# Patient Record
Sex: Male | Born: 1980 | Race: White | Hispanic: No | Marital: Married | Smoking: Current every day smoker
Health system: Southern US, Community
[De-identification: ages and names within clinical notes are randomized; demographics above are authoritative.]

## PROBLEM LIST (undated history)

## (undated) DIAGNOSIS — G629 Polyneuropathy, unspecified: Secondary | ICD-10-CM

## (undated) DIAGNOSIS — I10 Essential (primary) hypertension: Secondary | ICD-10-CM

## (undated) DIAGNOSIS — F1110 Opioid abuse, uncomplicated: Secondary | ICD-10-CM

## (undated) DIAGNOSIS — F419 Anxiety disorder, unspecified: Secondary | ICD-10-CM

## (undated) DIAGNOSIS — F32A Depression, unspecified: Secondary | ICD-10-CM

## (undated) DIAGNOSIS — R000 Tachycardia, unspecified: Secondary | ICD-10-CM

## (undated) HISTORY — PX: HAND SURGERY: SHX662

## (undated) HISTORY — DX: Polyneuropathy, unspecified: G62.9

## (undated) HISTORY — DX: Tachycardia, unspecified: R00.0

## (undated) HISTORY — PX: OTHER SURGICAL HISTORY: SHX169

---
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
## 2010-03-07 ENCOUNTER — Emergency Department (HOSPITAL_COMMUNITY): Admission: EM | Admit: 2010-03-07 | Discharge: 2010-03-07 | Payer: Self-pay | Admitting: Emergency Medicine

## 2010-07-17 LAB — URINALYSIS, ROUTINE W REFLEX MICROSCOPIC
Bilirubin Urine: NEGATIVE
Glucose, UA: NEGATIVE mg/dL
Hgb urine dipstick: NEGATIVE
Ketones, ur: NEGATIVE mg/dL
Protein, ur: 30 mg/dL — AB
Urobilinogen, UA: 0.2 mg/dL (ref 0.0–1.0)

## 2010-07-17 LAB — COMPREHENSIVE METABOLIC PANEL
ALT: 17 U/L (ref 0–53)
AST: 23 U/L (ref 0–37)
CO2: 25 mEq/L (ref 19–32)
Calcium: 9.4 mg/dL (ref 8.4–10.5)
Chloride: 102 mEq/L (ref 96–112)
Creatinine, Ser: 1.02 mg/dL (ref 0.4–1.5)
GFR calc non Af Amer: >60 mL/min (ref >60)
Glucose, Bld: 222 mg/dL — ABNORMAL HIGH (ref 70–99)
Sodium: 140 mEq/L (ref 135–145)
Total Bilirubin: 0.7 mg/dL (ref 0.3–1.2)

## 2010-07-17 LAB — CBC
HCT: 45.5 % (ref 39.0–52.0)
Hemoglobin: 15.4 g/dL (ref 13.0–17.0)
MCH: 31 pg (ref 26.0–34.0)
MCHC: 33.9 g/dL (ref 30.0–36.0)
RBC: 4.97 MIL/uL (ref 4.22–5.81)

## 2010-07-17 LAB — URINE MICROSCOPIC-ADD ON

## 2010-07-17 LAB — DIFFERENTIAL
Basophils Absolute: 0 10*3/uL (ref 0.0–0.1)
Basophils Relative: 0 % (ref 0–1)
Eosinophils Absolute: 0.1 10*3/uL (ref 0.0–0.7)
Eosinophils Relative: 1 % (ref 0–5)
Lymphs Abs: 4.5 10*3/uL — ABNORMAL HIGH (ref 0.7–4.0)
Neutrophils Relative %: 65 % (ref 43–77)

## 2010-07-17 LAB — RAPID URINE DRUG SCREEN, HOSP PERFORMED
Cocaine: NOT DETECTED
Opiates: POSITIVE — AB

## 2010-09-11 ENCOUNTER — Emergency Department (HOSPITAL_COMMUNITY)
Admission: EM | Admit: 2010-09-11 | Discharge: 2010-09-11 | Disposition: A | Discharge status: Home / Self Care | Payer: Self-pay | Attending: Emergency Medicine | Admitting: Emergency Medicine

## 2010-09-11 DIAGNOSIS — K089 Disorder of teeth and supporting structures, unspecified: Secondary | ICD-10-CM | POA: Insufficient documentation

## 2011-01-12 ENCOUNTER — Emergency Department (HOSPITAL_COMMUNITY)
Admission: EM | Admit: 2011-01-12 | Discharge: 2011-01-12 | Disposition: A | Discharge status: Home / Self Care | Payer: Self-pay | Attending: Emergency Medicine | Admitting: Emergency Medicine

## 2011-01-12 ENCOUNTER — Emergency Department (HOSPITAL_COMMUNITY): Payer: Self-pay

## 2011-01-12 DIAGNOSIS — M542 Cervicalgia: Secondary | ICD-10-CM | POA: Insufficient documentation

## 2011-01-12 DIAGNOSIS — R29898 Other symptoms and signs involving the musculoskeletal system: Secondary | ICD-10-CM | POA: Insufficient documentation

## 2011-01-12 DIAGNOSIS — M25519 Pain in unspecified shoulder: Secondary | ICD-10-CM | POA: Insufficient documentation

## 2011-11-09 IMAGING — CR DG CHEST 1V PORT
1 series · 1 of 1 positions shown · non-contrast
Comparison: None.

CLINICAL DATA: Patient unresponsive.  Shortness of breath.

PORTABLE CHEST - 1 VIEW

[series [date]]
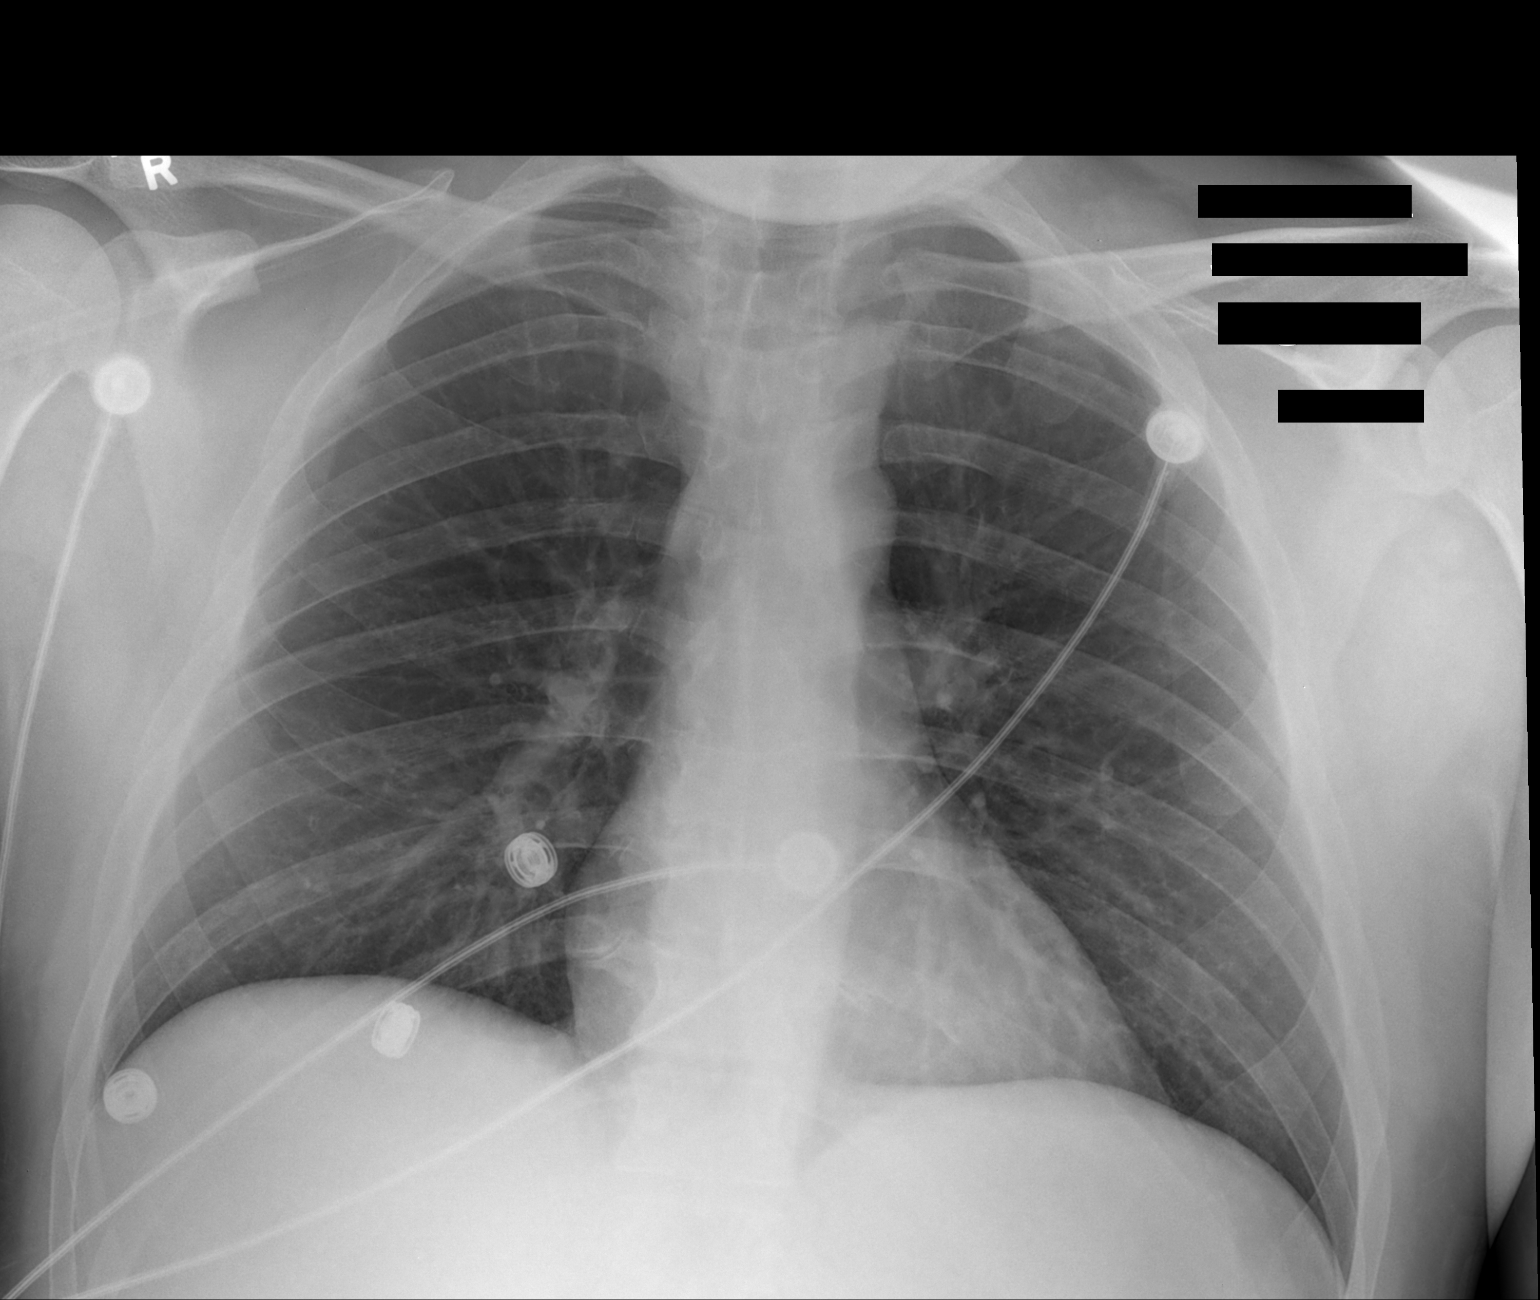

[1 of 1 positions shown; findings below may reference images not displayed]

FINDINGS: Lungs are clear.  No pleural effusion.  Heart size
normal.  No focal bony abnormality.
IMPRESSION: No acute disease.

## 2012-09-15 IMAGING — CR DG SHOULDER 2+V*L*
3 series · 3 of 3 positions shown · non-contrast
Comparison: None.

CLINICAL DATA: Pain after sleeping on shoulder wrong way.

LEFT SHOULDER - 2+ VIEW

[w shoulder internal left]
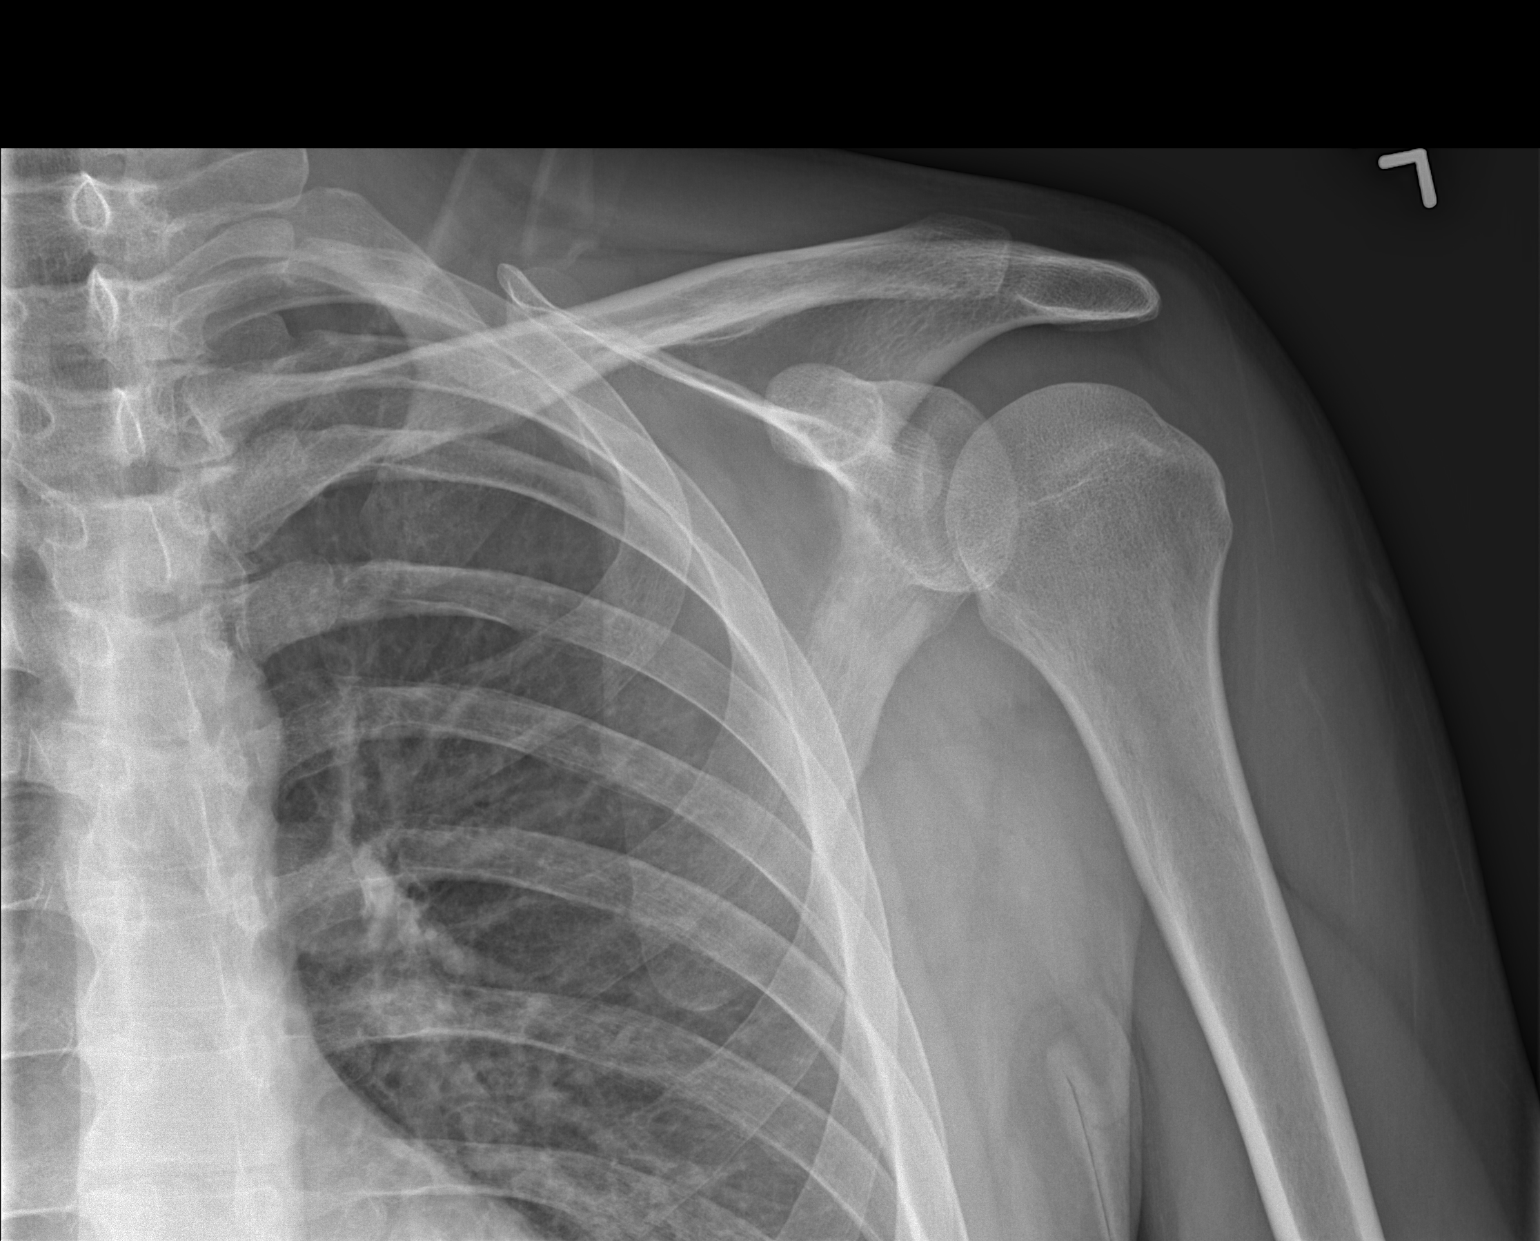

[w shoulder external left]
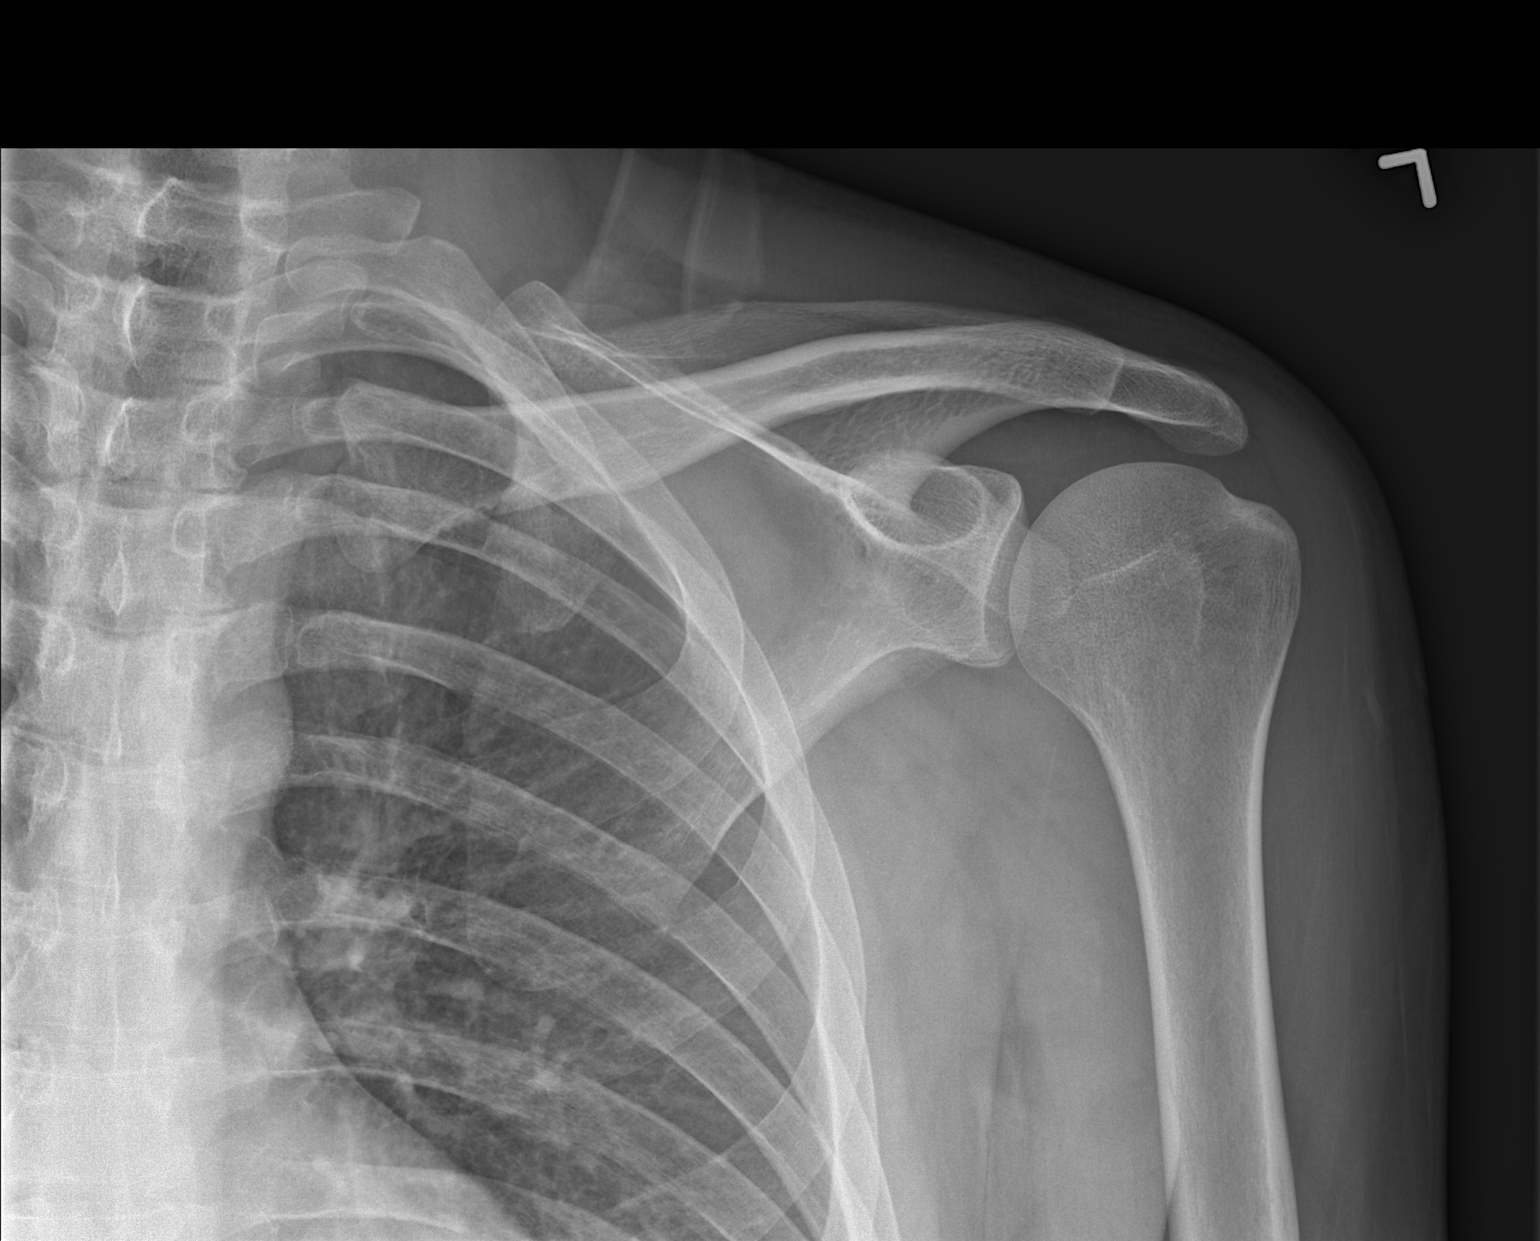

[w shoulder y-view left]
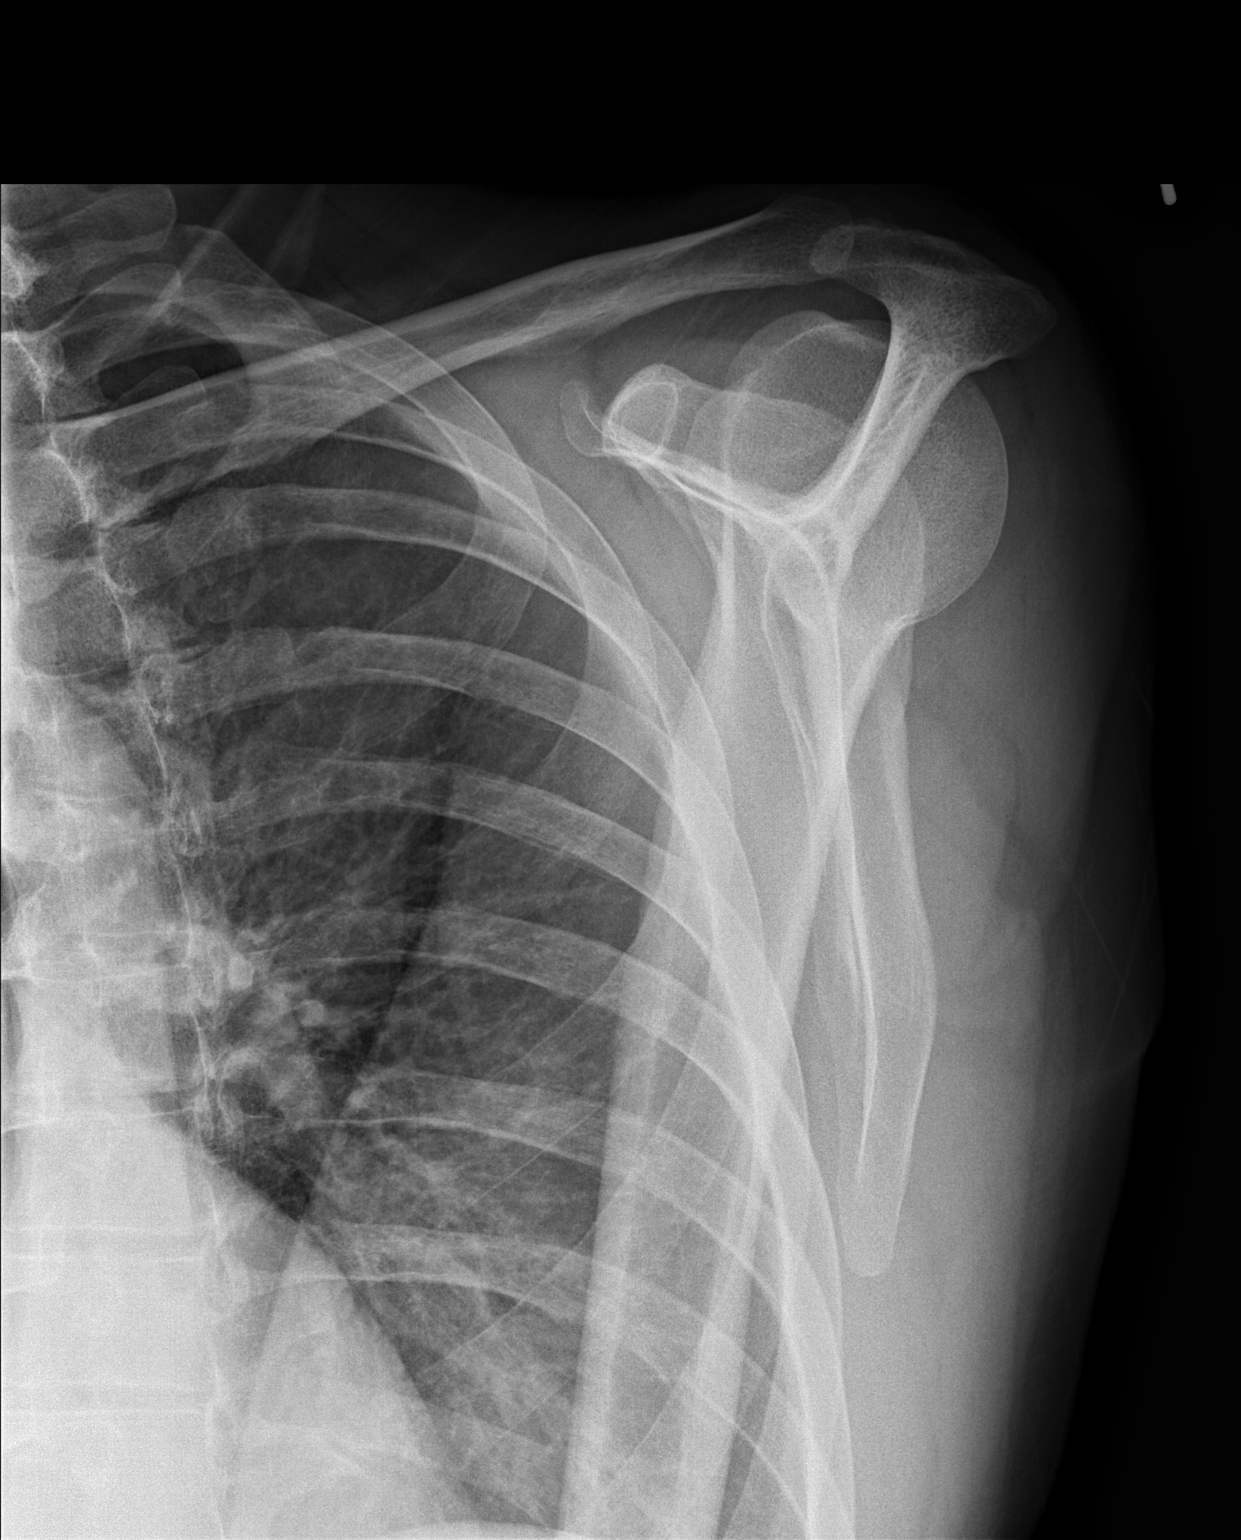

[3 of 3 positions shown; findings below may reference images not displayed]

FINDINGS: No fracture dislocation.  No soft tissue calcifications.
No significant degenerative changes.  Visualized lungs clear.
IMPRESSION: Negative.

## 2012-09-15 IMAGING — CR DG CERVICAL SPINE COMPLETE 4+V
5 series · 5 of 5 positions shown · non-contrast
Comparison: None

CLINICAL DATA: Left-sided pain

CERVICAL SPINE - COMPLETE 4+ VIEW

[w cervical spine ap]
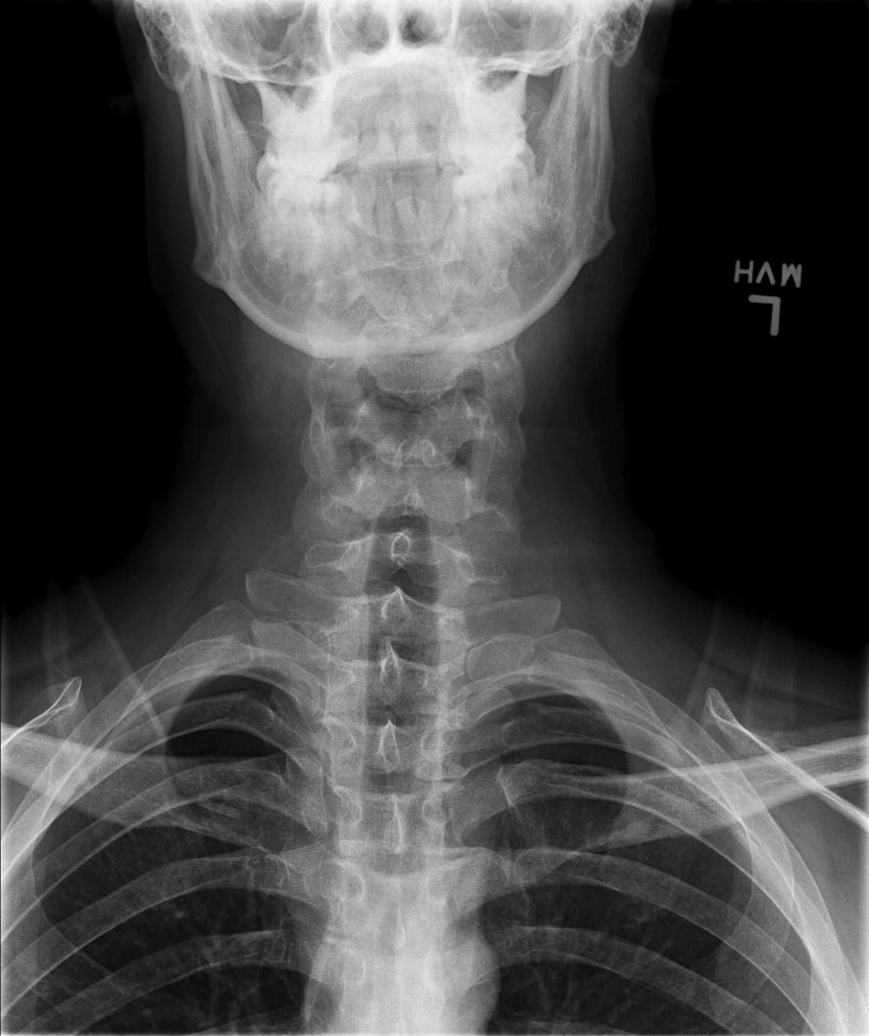

[w cervical spine odontoid]
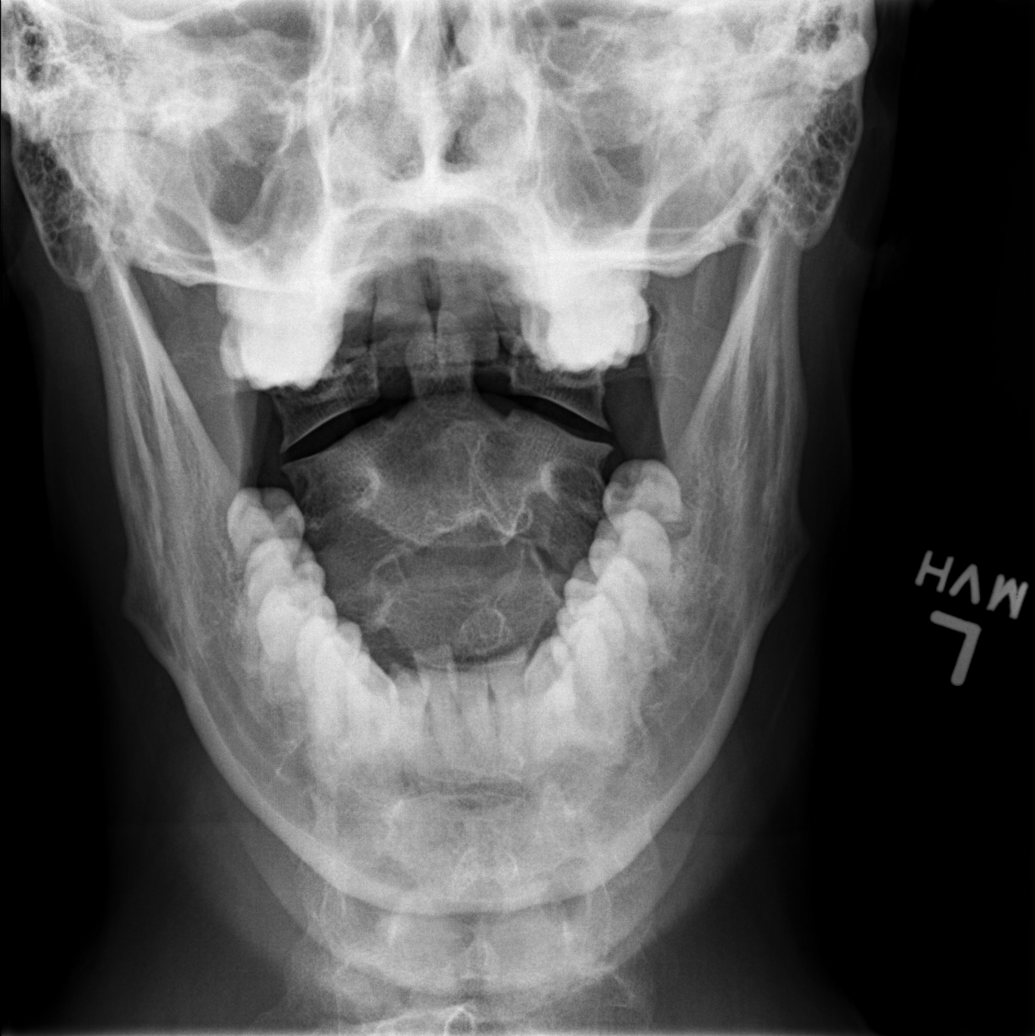

[w cervical spine lat]
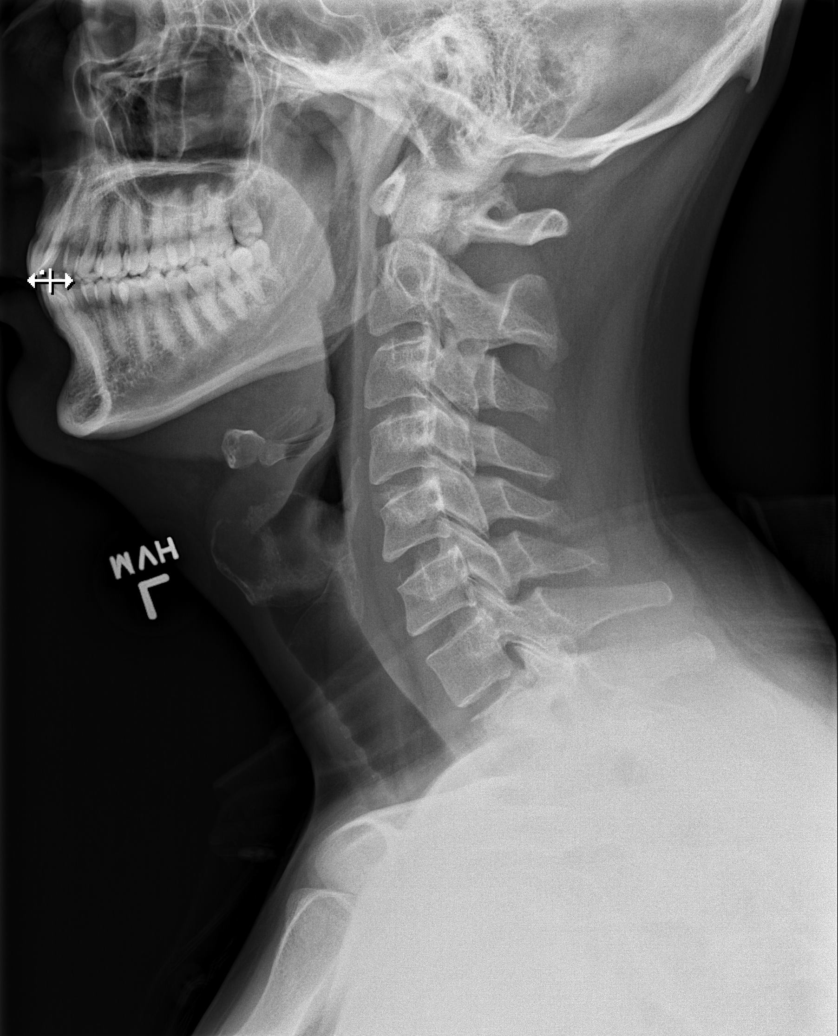

[w cervical spine ap_obl (1 of 2)]
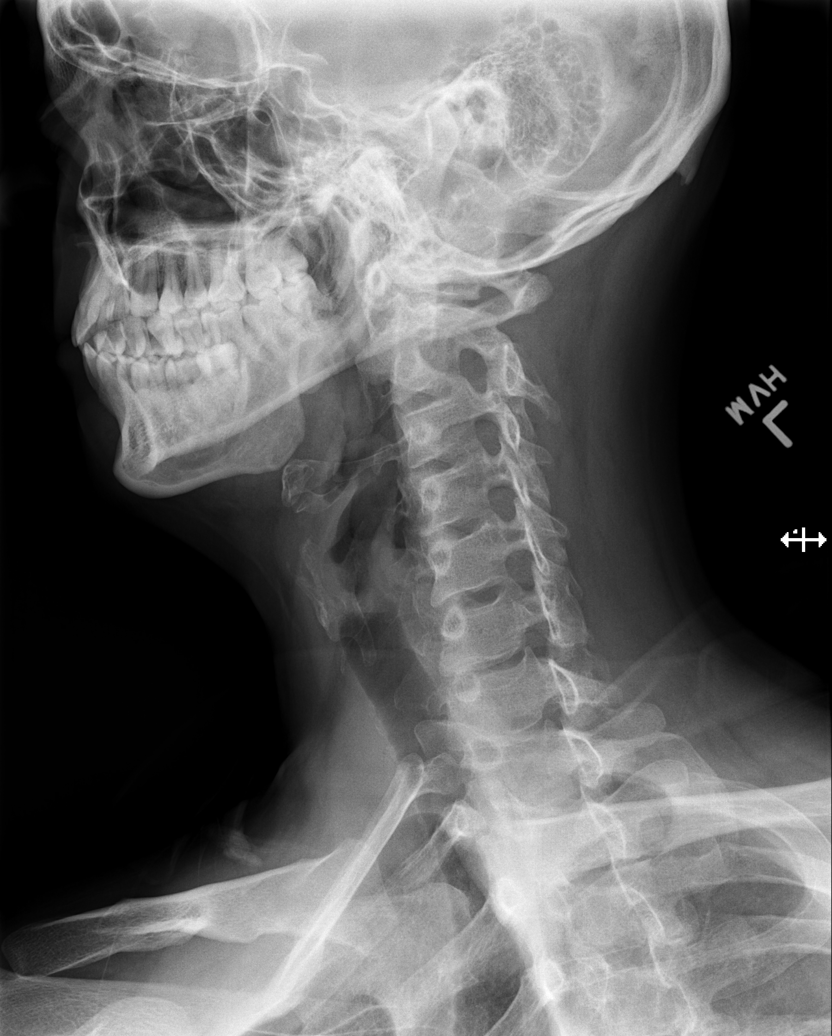

[w cervical spine ap_obl (2 of 2)]
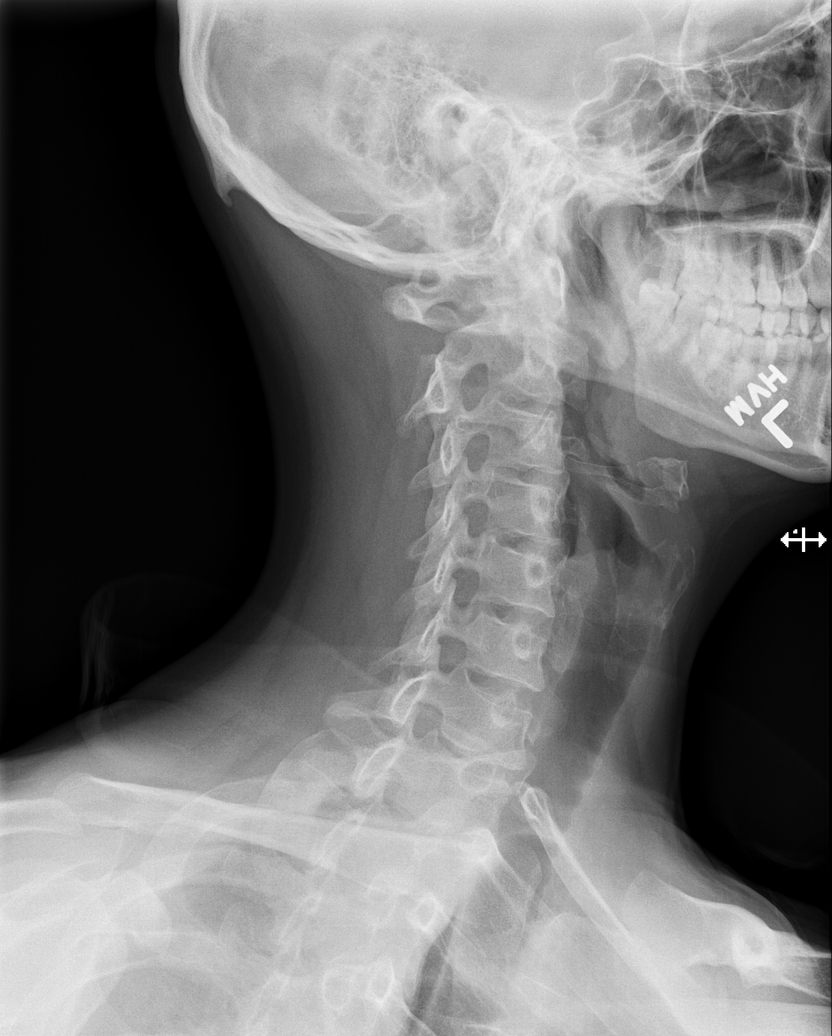

[5 of 5 positions shown; findings below may reference images not displayed]

FINDINGS: Alignment is normal.  No soft tissue swelling.  No disc
space narrowing.  No osteophyte formation.  Canal and foramina
appear widely patent.
IMPRESSION: Normal radiographs

## 2018-07-09 ENCOUNTER — Emergency Department (HOSPITAL_COMMUNITY): Payer: Self-pay

## 2018-07-09 ENCOUNTER — Other Ambulatory Visit: Payer: Self-pay

## 2018-07-09 ENCOUNTER — Encounter (HOSPITAL_COMMUNITY): Payer: Self-pay | Admitting: *Deleted

## 2018-07-09 ENCOUNTER — Emergency Department (HOSPITAL_COMMUNITY)
Admission: EM | Admit: 2018-07-09 | Discharge: 2018-07-09 | Disposition: A | Discharge status: Home / Self Care | Payer: Self-pay | Attending: Emergency Medicine | Admitting: Emergency Medicine

## 2018-07-09 DIAGNOSIS — X501XXA Overexertion from prolonged static or awkward postures, initial encounter: Secondary | ICD-10-CM | POA: Insufficient documentation

## 2018-07-09 DIAGNOSIS — Y939 Activity, unspecified: Secondary | ICD-10-CM | POA: Insufficient documentation

## 2018-07-09 DIAGNOSIS — M19079 Primary osteoarthritis, unspecified ankle and foot: Secondary | ICD-10-CM

## 2018-07-09 DIAGNOSIS — F17210 Nicotine dependence, cigarettes, uncomplicated: Secondary | ICD-10-CM | POA: Insufficient documentation

## 2018-07-09 DIAGNOSIS — S93401A Sprain of unspecified ligament of right ankle, initial encounter: Secondary | ICD-10-CM

## 2018-07-09 DIAGNOSIS — Y929 Unspecified place or not applicable: Secondary | ICD-10-CM | POA: Insufficient documentation

## 2018-07-09 DIAGNOSIS — Y999 Unspecified external cause status: Secondary | ICD-10-CM | POA: Insufficient documentation

## 2018-07-09 MED ORDER — IBUPROFEN 800 MG PO TABS
800.0000 mg | ORAL_TABLET | Freq: Once | ORAL | Status: AC
Start: 2018-07-09 — End: 2018-07-09
  Administered 2018-07-09: 800 mg via ORAL
  Filled 2018-07-09: qty 1

## 2018-07-09 NOTE — ED Provider Notes (Signed)
[facility]-EMERGENCY DEPT Provider Note   CSN: [id number] Arrival date & time: 07/09/18  0940    History   Chief Complaint Chief Complaint  Patient presents with  . Ankle Pain    HPI Hunter Henson is a 38 y.o. male here presenting with right ankle pain.  Patient states that about 2 days ago, he may have twisted his ankle.  He states that it progressively got more swollen.  Patient states that he has trouble bearing weight on the leg.  Patient took some Tylenol yesterday with minimal relief.  Denies any other injuries.  Patient states that he is otherwise healthy.     The history is provided by the patient.    History reviewed. No pertinent past medical history.  There are no active problems to display for this patient.   History reviewed. No pertinent surgical history.      Home Medications    Prior to Admission medications   Not on File    Family History No family history on file.  Social History Social History   Tobacco Use  . Smoking status: Current Every Day Smoker  Substance Use Topics  . Alcohol use: Yes    Frequency: Never  . Drug use: Not on file     Allergies   Patient has no allergy information on record.   Review of Systems Review of Systems  Musculoskeletal:       R ankle pain   All other systems reviewed and are negative.    Physical Exam Updated Vital Signs BP (!) 150/105 (BP Location: Left Arm)   Pulse 81   Temp 98.7 F (37.1 C) (Oral)   Resp 18   SpO2 (!) 18%   Physical Exam Vitals signs and nursing note reviewed.  HENT:     Head: Normocephalic.     Nose: Nose normal.  Eyes:     Pupils: Pupils are equal, round, and reactive to light.  Neck:     Musculoskeletal: Normal range of motion.  Cardiovascular:     Rate and Rhythm: Normal rate.  Pulmonary:     Effort: Pulmonary effort is normal.  Abdominal:     General: Abdomen is flat.  Musculoskeletal:     Comments: R ankle with swelling lateral  malleolus and tender over that area. No tib/fib tenderness, no tenderness or swelling of the foot. 2+ pulses, able to wiggle toes.   Skin:    Capillary Refill: Capillary refill takes less than 2 seconds.  Neurological:     General: No focal deficit present.     Mental Status: He is alert.  Psychiatric:        Mood and Affect: Mood normal.        Behavior: Behavior normal.      ED Treatments / Results  Labs (all labs ordered are listed, but only abnormal results are displayed) Labs Reviewed - No data to display  EKG None  Radiology No results found.  Procedures Procedures (including critical care time)  Medications Ordered in ED Medications  ibuprofen (ADVIL,MOTRIN) tablet 800 mg (has no administration in time range)     Initial Impression / Assessment and Plan / ED Course  I have reviewed the triage vital signs and the nursing notes.  Pertinent labs & imaging results that were available during my care of the patient were reviewed by me and considered in my medical decision making (see chart for details).       Hunter Henson is a 38  y.o. male here with R ankle pain after injury. Likely sprain vs small fracture. Will get xrays and give motrin.  11:42 AM Xray showed no fractures. However, there may have osteolytic changes. Given ankle air cast, crutches. Will have him follow up with ortho outpatient.     Final Clinical Impressions(s) / ED Diagnoses   Final diagnoses:  None    ED Discharge Orders    None       Charlynne Pander, MD 07/09/18 1143

## 2018-07-09 NOTE — ED Triage Notes (Signed)
Pt complains of right ankle pain and swelling x 2 days. Pain started while walking. Pt denies injury. Pt had injured his left foot 2 days prior to the pain starting, which he states now feels better.

## 2018-07-09 NOTE — Discharge Instructions (Signed)
You may have some early arthritis in your ankle joint from overuse.   Use ankle air cast and crutches for 3 days   Follow up with orthopedic doctor in a week if you still have pain   Return to ER if you have worse ankle pain or swelling, unable to bear weight

## 2020-12-04 DIAGNOSIS — I10 Essential (primary) hypertension: Secondary | ICD-10-CM | POA: Insufficient documentation

## 2021-04-24 ENCOUNTER — Encounter: Payer: Self-pay | Admitting: Emergency Medicine

## 2021-04-24 ENCOUNTER — Other Ambulatory Visit: Payer: Self-pay

## 2021-04-24 ENCOUNTER — Ambulatory Visit
Admission: EM | Admit: 2021-04-24 | Discharge: 2021-04-24 | Disposition: A | Discharge status: Home / Self Care | Payer: BC Managed Care – PPO | Attending: Physician Assistant | Admitting: Physician Assistant

## 2021-04-24 DIAGNOSIS — J0100 Acute maxillary sinusitis, unspecified: Secondary | ICD-10-CM | POA: Diagnosis not present

## 2021-04-24 MED ORDER — AMOXICILLIN-POT CLAVULANATE 875-125 MG PO TABS: 1.0000 | ORAL_TABLET | Freq: Two times a day (BID) | ORAL | 0 refills | Status: DC

## 2021-04-24 NOTE — ED Provider Notes (Signed)
[facility]    CSN: [id number] Arrival date & time: 04/24/21  1749      History   Chief Complaint Chief Complaint  Patient presents with   Nasal Congestion   Facial Pain    HPI Hunter Henson is a 40 y.o. male.   Patient here today for evaluation of right-sided earache, jaw pain and right-sided sinus pain and pressure that started recently.  He states about a week ago he started having upper respiratory symptoms that did improve until current symptoms started.  He has not had fever.  He has had mild cough.  He has tried over-the-counter medication with mild relief.  The history is provided by the patient.   History reviewed. No pertinent past medical history.  There are no problems to display for this patient.   History reviewed. No pertinent surgical history.     Home Medications    Prior to Admission medications   Medication Sig Start Date End Date Taking? Authorizing Provider  amoxicillin-clavulanate (AUGMENTIN) 875-125 MG tablet Take 1 tablet by mouth every 12 (twelve) hours. 04/24/21  Yes Tomi Bamberger, PA-C    Family History History reviewed. No pertinent family history.  Social History Social History   Tobacco Use   Smoking status: Every Day  Substance Use Topics   Alcohol use: Yes     Allergies   Patient has no known allergies.   Review of Systems Review of Systems  Constitutional:  Negative for chills and fever.  HENT:  Positive for congestion, sinus pressure and sinus pain. Negative for ear pain and sore throat.   Eyes:  Negative for discharge and redness.  Respiratory:  Positive for cough. Negative for shortness of breath.   Gastrointestinal:  Negative for abdominal pain, diarrhea, nausea and vomiting.    Physical Exam Triage Vital Signs ED Triage Vitals  Enc Vitals Group     BP 04/24/21 1845 139/90     Pulse Rate 04/24/21 1845 71     Resp 04/24/21 1845 16     Temp 04/24/21 1845 98.3 F (36.8 C)     Temp Source  04/24/21 1845 Oral     SpO2 04/24/21 1845 98 %     Weight --      Height --      Head Circumference --      Peak Flow --      Pain Score 04/24/21 1846 7     Pain Loc --      Pain Edu? --      Excl. in GC? --    No data found.  Updated Vital Signs BP 139/90 (BP Location: Left Arm)    Pulse 71    Temp 98.3 F (36.8 C) (Oral)    Resp 16    SpO2 98%       Physical Exam Vitals and nursing note reviewed.  Constitutional:      General: He is not in acute distress.    Appearance: Normal appearance. He is not ill-appearing.  HENT:     Head: Normocephalic and atraumatic.     Right Ear: Tympanic membrane normal.     Left Ear: Tympanic membrane normal.     Nose: Congestion present.     Mouth/Throat:     Mouth: Mucous membranes are moist.     Pharynx: Oropharynx is clear. No oropharyngeal exudate or posterior oropharyngeal erythema.  Eyes:     Conjunctiva/sclera: Conjunctivae normal.  Cardiovascular:     Rate and Rhythm: Normal rate  and regular rhythm.     Heart sounds: Normal heart sounds. No murmur heard. Pulmonary:     Effort: Pulmonary effort is normal. No respiratory distress.     Breath sounds: Normal breath sounds. No wheezing, rhonchi or rales.  Skin:    General: Skin is warm and dry.  Neurological:     Mental Status: He is alert.  Psychiatric:        Mood and Affect: Mood normal.        Thought Content: Thought content normal.     UC Treatments / Results  Labs (all labs ordered are listed, but only abnormal results are displayed) Labs Reviewed - No data to display  EKG   Radiology No results found.  Procedures Procedures (including critical care time)  Medications Ordered in UC Medications - No data to display  Initial Impression / Assessment and Plan / UC Course  I have reviewed the triage vital signs and the nursing notes.  Pertinent labs & imaging results that were available during my care of the patient were reviewed by me and considered in my  medical decision making (see chart for details).    Augmentin prescribed for suspected sinusitis.  Recommended follow-up if no improvement or symptoms worsen anyway.  Final Clinical Impressions(s) / UC Diagnoses   Final diagnoses:  Acute maxillary sinusitis, recurrence not specified   Discharge Instructions   None    ED Prescriptions     Medication Sig Dispense Auth. Provider   amoxicillin-clavulanate (AUGMENTIN) 875-125 MG tablet Take 1 tablet by mouth every 12 (twelve) hours. 14 tablet Tomi Bamberger, PA-C      PDMP not reviewed this encounter.   Tomi Bamberger, PA-C 04/24/21 1925

## 2021-04-24 NOTE — ED Triage Notes (Signed)
Daughter recently sick with URI. Patient began having symptoms a week ago, got better, then began experiencing a right sided ear ache, jaw pain, pain/pressure beginning behind right eye with bright green nasal mucus.

## 2022-02-08 ENCOUNTER — Other Ambulatory Visit: Payer: Self-pay

## 2022-02-08 ENCOUNTER — Emergency Department (HOSPITAL_COMMUNITY): Payer: BC Managed Care – PPO

## 2022-02-08 ENCOUNTER — Emergency Department (HOSPITAL_COMMUNITY)
Admission: EM | Admit: 2022-02-08 | Discharge: 2022-02-08 | Discharge status: Against Medical Advice | Payer: BC Managed Care – PPO | Attending: Emergency Medicine | Admitting: Emergency Medicine

## 2022-02-08 ENCOUNTER — Encounter (HOSPITAL_COMMUNITY): Payer: Self-pay | Admitting: Emergency Medicine

## 2022-02-08 DIAGNOSIS — I10 Essential (primary) hypertension: Secondary | ICD-10-CM | POA: Diagnosis not present

## 2022-02-08 DIAGNOSIS — R002 Palpitations: Secondary | ICD-10-CM | POA: Insufficient documentation

## 2022-02-08 DIAGNOSIS — R0789 Other chest pain: Secondary | ICD-10-CM | POA: Diagnosis present

## 2022-02-08 DIAGNOSIS — Z5321 Procedure and treatment not carried out due to patient leaving prior to being seen by health care provider: Secondary | ICD-10-CM | POA: Insufficient documentation

## 2022-02-08 HISTORY — DX: Anxiety disorder, unspecified: F41.9

## 2022-02-08 HISTORY — DX: Essential (primary) hypertension: I10

## 2022-02-08 HISTORY — DX: Depression, unspecified: F32.A

## 2022-02-08 HISTORY — DX: Opioid abuse, uncomplicated: F11.10

## 2022-02-08 NOTE — ED Provider Triage Note (Signed)
Emergency Medicine Provider Triage Evaluation Note  Gurnoor Ursua , a 41 y.o. male  was evaluated in triage.  Pt complains of 3 episodes of intermittent chest pain and palpitations over the last 7 to 10 days.  Has clonidine for HTN as needed per patient.  Took one of the clonidine's each time he felt these symptoms, (twice last week and once yesterday.  Most recent episode last night, also took 1 of significant others metoprolol as at that time.  Though has improved, had not fully resolved until arrival to ED.  Denies active chest pain and reports feeling overall "better", though endorses current palpitations described as "thumping".  Denies fever, shortness of breath, N/V/D, changes in urinary/bowel habits, abdominal pain, back pain.  Review of Systems  Positive:  Negative: See above  Physical Exam  BP (!) 139/96 (BP Location: Right Arm)   Pulse 86   Temp 98.3 F (36.8 C) (Oral)   Resp 18   Ht 5' 11.5" (1.816 m)   Wt 89.4 kg   SpO2 97%   BMI 27.09 kg/m  Gen:   Awake, no distress   Resp:  Normal effort  MSK:   Moves extremities without difficulty  Other:  RRR, chest non-TTP  Medical Decision Making  Medically screening exam initiated at 1:06 PM.  Appropriate orders placed.  Darus Hershman was informed that the remainder of the evaluation will be completed by another provider, this initial triage assessment does not replace that evaluation, and the importance of remaining in the ED until their evaluation is complete.  Patient reports significant difficulty with IV access.  States he has "no good access points in the arms... last time they had to use my femoral vein which was horrible".  Labs not able to be obtained at this time with resources in triage.   Prince Rome, PA-C 63/01/60 1313

## 2022-02-08 NOTE — ED Triage Notes (Signed)
Patient c/o intermittent left sided chest pain since last week. Patient reports two episodes last night. Pain is non radiating. Denies any nausea or shortness of breath. Patients significant other reports giving patient 25 mg of metoprolol due to hypertension. 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

## 2022-03-06 DIAGNOSIS — R000 Tachycardia, unspecified: Secondary | ICD-10-CM | POA: Insufficient documentation

## 2022-07-20 DIAGNOSIS — G629 Polyneuropathy, unspecified: Secondary | ICD-10-CM | POA: Insufficient documentation

## 2023-02-19 ENCOUNTER — Other Ambulatory Visit: Payer: Self-pay

## 2023-02-19 ENCOUNTER — Emergency Department (HOSPITAL_BASED_OUTPATIENT_CLINIC_OR_DEPARTMENT_OTHER): Payer: Medicaid Other

## 2023-02-19 ENCOUNTER — Emergency Department (HOSPITAL_BASED_OUTPATIENT_CLINIC_OR_DEPARTMENT_OTHER)
Admission: EM | Admit: 2023-02-19 | Discharge: 2023-02-19 | Disposition: A | Discharge status: Home / Self Care | Payer: Medicaid Other | Attending: Emergency Medicine | Admitting: Emergency Medicine

## 2023-02-19 ENCOUNTER — Encounter (HOSPITAL_BASED_OUTPATIENT_CLINIC_OR_DEPARTMENT_OTHER): Payer: Self-pay | Admitting: Emergency Medicine

## 2023-02-19 DIAGNOSIS — R413 Other amnesia: Secondary | ICD-10-CM | POA: Insufficient documentation

## 2023-02-19 DIAGNOSIS — R404 Transient alteration of awareness: Secondary | ICD-10-CM | POA: Diagnosis not present

## 2023-02-19 DIAGNOSIS — Z1152 Encounter for screening for COVID-19: Secondary | ICD-10-CM | POA: Insufficient documentation

## 2023-02-19 DIAGNOSIS — R410 Disorientation, unspecified: Secondary | ICD-10-CM | POA: Diagnosis present

## 2023-02-19 LAB — CBC WITH DIFFERENTIAL/PLATELET
Abs Immature Granulocytes: 0.01 10*3/uL (ref 0.00–0.07)
Basophils Absolute: 0 10*3/uL (ref 0.0–0.1)
Basophils Relative: 1 %
Eosinophils Absolute: 0.4 10*3/uL (ref 0.0–0.5)
Eosinophils Relative: 6 %
HCT: 41.9 % (ref 39.0–52.0)
Hemoglobin: 14.6 g/dL (ref 13.0–17.0)
Immature Granulocytes: 0 %
Lymphocytes Relative: 37 %
Lymphs Abs: 2.4 10*3/uL (ref 0.7–4.0)
MCH: 32.6 pg (ref 26.0–34.0)
MCHC: 34.8 g/dL (ref 30.0–36.0)
MCV: 93.5 fL (ref 80.0–100.0)
Monocytes Absolute: 0.4 10*3/uL (ref 0.1–1.0)
Monocytes Relative: 6 %
Neutro Abs: 3.3 10*3/uL (ref 1.7–7.7)
Neutrophils Relative %: 50 %
Platelets: 187 10*3/uL (ref 150–400)
RBC: 4.48 MIL/uL (ref 4.22–5.81)
RDW: 12.1 % (ref 11.5–15.5)
WBC: 6.5 10*3/uL (ref 4.0–10.5)
nRBC: 0 % (ref 0.0–0.2)

## 2023-02-19 LAB — I-STAT VENOUS BLOOD GAS, ED
Acid-Base Excess: 2 mmol/L (ref 0.0–2.0)
Bicarbonate: 28.6 mmol/L — ABNORMAL HIGH (ref 20.0–28.0)
Calcium, Ion: 1.21 mmol/L (ref 1.15–1.40)
HCT: 44 % (ref 39.0–52.0)
Hemoglobin: 15 g/dL (ref 13.0–17.0)
O2 Saturation: 51 %
Potassium: 4 mmol/L (ref 3.5–5.1)
Sodium: 139 mmol/L (ref 135–145)
TCO2: 30 mmol/L (ref 22–32)
pCO2, Ven: 53.5 mm[Hg] (ref 44–60)
pH, Ven: 7.336 (ref 7.25–7.43)
pO2, Ven: 30 mm[Hg] — CL (ref 32–45)

## 2023-02-19 LAB — COMPREHENSIVE METABOLIC PANEL
ALT: 61 U/L — ABNORMAL HIGH (ref 0–44)
AST: 46 U/L — ABNORMAL HIGH (ref 15–41)
Albumin: 4.8 g/dL (ref 3.5–5.0)
Alkaline Phosphatase: 68 U/L (ref 38–126)
Anion gap: 12 (ref 5–15)
BUN: 19 mg/dL (ref 6–20)
CO2: 27 mmol/L (ref 22–32)
Calcium: 9.6 mg/dL (ref 8.9–10.3)
Chloride: 99 mmol/L (ref 98–111)
Creatinine, Ser: 0.65 mg/dL (ref 0.61–1.24)
GFR, Estimated: >60 mL/min (ref >60)
Glucose, Bld: 107 mg/dL — ABNORMAL HIGH (ref 70–99)
Potassium: 4.1 mmol/L (ref 3.5–5.1)
Sodium: 138 mmol/L (ref 135–145)
Total Bilirubin: 1 mg/dL (ref 0.3–1.2)
Total Protein: 7.9 g/dL (ref 6.5–8.1)

## 2023-02-19 LAB — RAPID URINE DRUG SCREEN, HOSP PERFORMED
Amphetamines: POSITIVE — AB
Barbiturates: NOT DETECTED
Benzodiazepines: POSITIVE — AB
Cocaine: NOT DETECTED
Opiates: NOT DETECTED
Tetrahydrocannabinol: NOT DETECTED

## 2023-02-19 LAB — URINALYSIS, MICROSCOPIC (REFLEX)

## 2023-02-19 LAB — RESP PANEL BY RT-PCR (RSV, FLU A&B, COVID)  RVPGX2
Influenza A by PCR: NEGATIVE
Influenza B by PCR: NEGATIVE
Resp Syncytial Virus by PCR: NEGATIVE
SARS Coronavirus 2 by RT PCR: NEGATIVE

## 2023-02-19 LAB — URINALYSIS, ROUTINE W REFLEX MICROSCOPIC
Glucose, UA: NEGATIVE mg/dL
Hgb urine dipstick: NEGATIVE
Ketones, ur: 15 mg/dL — AB
Nitrite: NEGATIVE
Protein, ur: NEGATIVE mg/dL
Specific Gravity, Urine: 1.025 (ref 1.005–1.030)
pH: 6.5 (ref 5.0–8.0)

## 2023-02-19 LAB — AMMONIA: Ammonia: <10 umol/L (ref 9–35)

## 2023-02-19 LAB — TROPONIN I (HIGH SENSITIVITY): Troponin I (High Sensitivity): 2 ng/L (ref <18)

## 2023-02-19 LAB — ETHANOL: Alcohol, Ethyl (B): <10 mg/dL (ref <10)

## 2023-02-19 LAB — CBG MONITORING, ED: Glucose-Capillary: 97 mg/dL (ref 70–99)

## 2023-02-19 NOTE — ED Triage Notes (Signed)
Pt here with several episodes in the last 2 wks of passing out, "falling asleep" for hours in various places; erratic driving and confusion with his minor child in the car with him; wife could not wake him for 2 hrs this morning

## 2023-02-19 NOTE — Discharge Instructions (Addendum)
1.  At this time the exact cause for your episodes of confusion and unresponsiveness has not been identified.  It is very important that you get seen in follow-up as soon as possible.  A referral has been placed to [facility].  Call tomorrow to schedule your appointment as soon as possible. 2.  Avoid any type of drugs or alcohol at this time.  It is possible that they are triggering seizures or change in level of consciousness. 3.  Rest and eat nutritious food. 4.  You cannot drive your car, climb ladders or do any activities that could result in significant injury if you had a seizure or episode of passing out.  Do not do any these activities until you have been seen by neurology and cleared to go back to usual activities.

## 2023-02-19 NOTE — ED Provider Notes (Signed)
[place] [facility] Provider Note   CSN: [id number] Arrival date & time: 02/19/23  1527     History  Chief Complaint  Patient presents with   Altered Mental Status    Hunter Henson is a 42 y.o. male.  HPI Patient has been having episodes of extreme drowsiness and unexpectedly falling asleep.  This has been happening over the past several weeks.  He has also had periods of confusion and forgetting where he was going when he was driving and driving erratically.  This morning, the patient's wife reports she could not wake him up for almost 2 hours.  She was shaking him and trying to wake him but he was in a deep sleep.  Patient's wife called EMS and patient to finally awakened and they were able to do his CBG which was normal.  He then was at essentially normal baseline mental status and did not transport to the emergency department at that time.  3 days ago the patient went to do an inspection of the house for his daughter and apparently ended up falling asleep in the neighbors yard.  On a family member came to check on him, he did awaken immediately.  Patient denies has been having headaches.  He has been having a lot of joint pains and aches.  He recently underwent evaluation at his PCP whereupon his test for Sjogren's and rheumatoid arthritis apparently came back positive.  Patient is being referred to rheumatology.  He denies he had fevers or chills.  He has has not had nausea or vomiting.  Patient does take Suboxone.  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  Patient drinks a couple drinks per night.  He denies any other drug use currently.  Patient's wife reports she suspects maybe he is having seizures.  She reports patient's father was diagnosed as having seizures which were attributed to alcohol use but ultimately was epilepsy.  Patient also apparently has significant sleep apnea.    Home Medications Prior to  Admission medications   Medication Sig Start Date End Date Taking? Authorizing Provider  amoxicillin-clavulanate (AUGMENTIN) 875-125 MG tablet Take 1 tablet by mouth every 12 (twelve) hours. 04/24/21   Tomi Bamberger, PA-C      Allergies    Patient has no known allergies.    Review of Systems   Review of Systems  Physical Exam Updated Vital Signs BP 126/84   Pulse 64   Temp 98 F (36.7 C)   Resp 18   Ht 5\' 11"  (1.803 m)   Wt 76.2 kg   SpO2 99%   BMI 23.43 kg/m  Physical Exam Constitutional:      Comments: Alert nontoxic.  Well-nourished well-developed.  Thin but not cachectic.  HENT:     Head: Normocephalic and atraumatic.     Nose: Nose normal.     Mouth/Throat:     Mouth: Mucous membranes are moist.     Pharynx: Oropharynx is clear.     Comments: Patient reports he has little sores on the underside of his tongue and apparently is in the area where he uses Suboxone.  May be a subtle cobblestoning appearance of the mucosa of the inferior aspect of the tongue but I do not appreciate any significant erosions, ulcers or other significant lesions. Eyes:     Extraocular Movements: Extraocular movements intact.     Pupils: Pupils are equal, round, and reactive to light.  Neck:     Comments: Supple.  No lymphadenopathy. Pulmonary:     Comments: No axillary lymphadenopathy. Abdominal:     General: There is no distension.     Palpations: Abdomen is soft.     Tenderness: There is no abdominal tenderness. There is no guarding.     Comments: No inguinal lymphadenopathy.  Musculoskeletal:        General: No swelling or tenderness. Normal range of motion.     Right lower leg: No edema.     Left lower leg: No edema.  Skin:    General: Skin is warm and dry.  Neurological:     General: No focal deficit present.     Mental Status: He is oriented to person, place, and time.     Motor: No weakness.     Coordination: Coordination normal.  Psychiatric:        Mood and Affect:  Mood normal.     ED Results / Procedures / Treatments   Labs (all labs ordered are listed, but only abnormal results are displayed) Labs Reviewed  COMPREHENSIVE METABOLIC PANEL - Abnormal; Notable for the following components:      Result Value   Glucose, Bld 107 (*)    AST 46 (*)    ALT 61 (*)    All other components within normal limits  URINALYSIS, ROUTINE W REFLEX MICROSCOPIC - Abnormal; Notable for the following components:   Bilirubin Urine SMALL (*)    Ketones, ur 15 (*)    Leukocytes,Ua SMALL (*)    All other components within normal limits  RAPID URINE DRUG SCREEN, HOSP PERFORMED - Abnormal; Notable for the following components:   Benzodiazepines POSITIVE (*)    Amphetamines POSITIVE (*)    All other components within normal limits  URINALYSIS, MICROSCOPIC (REFLEX) - Abnormal; Notable for the following components:   Bacteria, UA RARE (*)    All other components within normal limits  I-STAT VENOUS BLOOD GAS, ED - Abnormal; Notable for the following components:   pO2, Ven 30 (*)    Bicarbonate 28.6 (*)    All other components within normal limits  RESP PANEL BY RT-PCR (RSV, FLU A&B, COVID)  RVPGX2  ETHANOL  CBC WITH DIFFERENTIAL/PLATELET  AMMONIA  CBG MONITORING, ED  TROPONIN I (HIGH SENSITIVITY)  TROPONIN I (HIGH SENSITIVITY)    EKG None  Radiology CT Head Wo Contrast  Result Date: 02/19/2023 CLINICAL DATA:  Mental status change, unknown cause. EXAM: CT HEAD WITHOUT CONTRAST TECHNIQUE: Contiguous axial images were obtained from the base of the skull through the vertex without intravenous contrast. RADIATION DOSE REDUCTION: This exam was performed according to the departmental dose-optimization program which includes automated exposure control, adjustment of the mA and/or kV according to patient size and/or use of iterative reconstruction technique. COMPARISON:  None Available. FINDINGS: Brain: There is no evidence of an acute infarct, intracranial hemorrhage,  mass, midline shift, or extra-axial fluid collection. The ventricles and sulci are normal. Vascular: No hyperdense vessel. Skull: No acute fracture or suspicious osseous lesion. Sinuses/Orbits: Mild mucosal thickening in the paranasal sinuses. Minimal fluid in the left maxillary sinus. Clear mastoid air cells. Unremarkable orbits. Other: None. IMPRESSION: 1. Unremarkable CT appearance of the brain. 2. Mild inflammatory sinus disease. Electronically Signed   By: Sebastian Ache M.D.   On: 02/19/2023 19:32   DG Chest 2 View  Result Date: 02/19/2023 CLINICAL DATA:  Cough.  Confusion and drowsy. EXAM: CHEST - 2 VIEW COMPARISON:  02/05/2022 FINDINGS: The cardiomediastinal  contours are normal. Mild chronic hyperinflation, likely smoking related. Pulmonary vasculature is normal. No consolidation, pleural effusion, or pneumothorax. Remote left posterior third and fourth rib fractures. No acute osseous abnormalities are seen. IMPRESSION: No active cardiopulmonary disease. Electronically Signed   By: Narda Rutherford M.D.   On: 02/19/2023 18:42    Procedures Procedures    Medications Ordered in ED Medications - No data to display  ED Course/ Medical Decision Making/ A&P                                 Medical Decision Making Amount and/or Complexity of Data Reviewed Labs: ordered. Radiology: ordered.   Patient has been having episodes of changing mental status as outlined in HPI.  Sometimes this manifests as intense somnolence and inability to be awakened.  Other times he has some confusion and memory loss while awake.  Patient does not have significant past medical history except for prior opioid dependency and IV drug use however, he has been greater than 1 year from IV drug abuse.  Patient does take Suboxone.  He reports occasionally taking some Xanax and limited alcohol use.  He does not have previous history of seizure disorder however patient's wife notes that occasionally she has noted him to have  some jerking activity and often wondered if he might have seizure disorder based on the patient's father having a seizure disorder.  Patient has been tested on outpatient basis for immune disorders based on joint pain.  He had negative HIV and negative Lyme testing.  Clinically patient is well and nontoxic-appearing.  He has a normal neurologic examination.  At this time I have low suspicion for stroke.  Consideration given for intracranial mass or tumor, metabolic derangement, substance use, seizure disorder.  Will proceed with broad diagnostic evaluation occluding CT scan of the head and chest x-ray, venous gas to rule out hypercarbia, ammonia metabolic panel and CBC.  CT head reviewed by radiology no acute findings.  Chest x-ray no acute findings.  Patient test negative for influenza or COVID with stable CBC and alcohol less than 10.  UDS is positive for benzodiazepine and amphetamine.  Patient reports Xanax use 2 days ago but none yesterday.  He reports occasional use.  At this time with neurologic exam normal, patient alert, vital signs stable and CT head without acute findings, I do feel he is stable for discharge.  Symptoms have been waxing and waning and I do not suspect acute stroke\meningitis\intracranial infectious etiology such as encephalitis.  Will plan for discharge with expeditious follow-up with neurology and continue to work with PCP.  I have done amatory referral to [facility] with driving restrictions for no driving until complete evaluation for possible seizure disorder, patient is aware no climbing ladders or other dangerous activities.  He and his wife voiced understanding.        Final Clinical Impression(s) / ED Diagnoses Final diagnoses:  Transient alteration of awareness  Confusion  Memory deficit    Rx / DC Orders ED Discharge Orders          Ordered    Ambulatory referral to Neurology       Comments: An appointment is requested in  approximately: 1 week   02/19/23 1952              Arby Barrette, MD 02/19/23 2004

## 2023-02-25 ENCOUNTER — Encounter: Payer: Self-pay | Admitting: Neurology

## 2023-02-25 ENCOUNTER — Ambulatory Visit: Payer: Medicaid Other | Admitting: Neurology

## 2023-07-23 NOTE — Progress Notes (Unsigned)
 Office Visit Note  Patient: Hunter Henson             Date of Birth: 11-Jul-1980           MRN: [id number]             PCP: Lance Bosch, NP Referring: Lance Bosch, NP Visit Date: 07/24/2023 Occupation: Contractor  Subjective:  New Patient (Initial Visit) (Patient states his hands are his main problem. Patient states he has pain and swelling in his hands. Patient states he drops things and his hand pain wakes him up at night. Patient states the hand pain comes and goes like flares. Patient states he does get numbness in his hands and fingers sometimes that comes and goes. )   Discussed the use of AI scribe software for clinical note transcription with the patient, who gave verbal consent to proceed.  History of Present Illness   Hunter Henson is a 43 year old male who presents with hand pain, numbness, and swelling. He is accompanied by his wife. He was referred for evaluation of hand symptoms and abnormal lab test results.  He has been experiencing hand symptoms for approximately a year and a half, which began after using a vibrating saw for extended periods. Initially, numbness was noted in the fingertips, gradually progressing to involve both entire hands. Most sensation has returned, but some intermittent numbness persists. The symptoms are severe enough to disrupt sleep, causing him to wake up and walk around to alleviate discomfort. He describes the symptoms as having periods of flare-ups, lasting for days to weeks, with occasional swelling but no significant discoloration. No similar numbness in the feet or new rashes have been noted.  He has had mouth sores associated with Suboxone use, which coincided with severe hand flare-ups. He does not typically experience dry mouth or significant swelling in the cheeks or neck lymph nodes. For symptom management, he occasionally uses Voltaren gel and has been taking gabapentin for less than a year to address the numbness. He has not  undergone a nerve conduction study and has no prior diagnosis of neuropathy.  He reports a history of knee pain, particularly in the right knee, which causes a burning sensation when kneeling. He uses knee pads to mitigate the discomfort and takes Aleve for inflammation and pain management. Other medications include clonidine, metoprolol, Tylenol, and Suboxone.  His social history includes a past of IV drug use for over twenty years, although he has been abstinent for a couple of years. 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      ANA pos SSA 6.2 dsDNA, SSB, Sm, RNP neg RF neg CCP neg Lyme neg UA 6.7 RPR neg  Xray hands Right 3rd metacarpal head subchondral cysts  Activities of Daily Living:  Patient reports morning stiffness for 30-45 minutes.   Patient Reports nocturnal pain.  Difficulty dressing/grooming: Denies Difficulty climbing stairs: Denies Difficulty getting out of chair: Denies Difficulty using hands for taps, buttons, cutlery, and/or writing: Reports  Review of Systems  Constitutional:  Positive for fatigue.  HENT:  Positive for mouth sores and mouth dryness.   Eyes:  Negative for dryness.  Respiratory:  Negative for shortness of breath.   Cardiovascular:  Positive for chest pain and palpitations.  Gastrointestinal:  Positive for blood in stool and constipation. Negative for diarrhea.  Endocrine: Negative for increased urination.  Genitourinary:  Negative for involuntary urination.  Musculoskeletal:  Positive  for joint pain, gait problem, joint pain, joint swelling, morning stiffness and muscle tenderness. Negative for myalgias, muscle weakness and myalgias.  Skin:  Negative for color change, rash, hair loss and sensitivity to sunlight.  Allergic/Immunologic: Negative for susceptible to infections.  Neurological:  Negative for dizziness and headaches.  Hematological:  Negative for swollen glands.   Psychiatric/Behavioral:  Positive for depressed mood. Negative for sleep disturbance. The patient is nervous/anxious.     PMFS History:  Patient Active Problem List   Diagnosis Date Noted   Positive ANA (antinuclear antibody) 07/24/2023   Other bursitis of knee, right knee 07/24/2023   Neuropathy 07/20/2022   Tachycardia 03/06/2022   Hypertension 12/04/2020   Polysubstance abuse (HCC) 12/20/1994    Past Medical History:  Diagnosis Date   Anxiety    Depression    Heroin abuse (HCC)    Hypertension    Neuropathy    Tachycardia     Family History  Problem Relation Age of Onset   Alcoholism Mother    Hypertension Mother    Depression Mother    Anxiety disorder Mother    Alcoholism Father    Seizures Father    Depression Father    Anxiety disorder Father    Alcoholism Sister    Depression Sister    Anxiety disorder Sister    Depression Sister    Anxiety disorder Sister    Past Surgical History:  Procedure Laterality Date   arm surgery     HAND SURGERY     Social History   Social History Narrative   Not on file    There is no immunization history on file for this patient.   Objective: Vital Signs: BP 133/86 (BP Location: Right Arm, Patient Position: Sitting, Cuff Size: Large)   Pulse 63   Resp 14   Ht 5\' 11"  (1.803 m)   Wt 190 lb (86.2 kg)   BMI 26.50 kg/m    Physical Exam HENT:     Mouth/Throat:     Mouth: Mucous membranes are moist.     Pharynx: Oropharynx is clear.  Eyes:     Conjunctiva/sclera: Conjunctivae normal.  Cardiovascular:     Rate and Rhythm: Normal rate and regular rhythm.  Pulmonary:     Effort: Pulmonary effort is normal.     Breath sounds: Normal breath sounds.  Musculoskeletal:     Right lower leg: No edema.     Left lower leg: No edema.  Lymphadenopathy:     Cervical: No cervical adenopathy.  Skin:    General: Skin is warm and dry.  Neurological:     Mental Status: He is alert.  Psychiatric:        Mood and Affect: Mood  normal.      Musculoskeletal Exam:  Elbows full ROM no tenderness or swelling Wrists full ROM no tenderness or swelling, negative tinel and phalens Fingers full ROM, right 3rd MCP and PIP mildly tender, some chronic joint thickening no palpable synovitis Hip normal internal and external rotation without pain, no tenderness to lateral hip palpation Knees full ROM, right knee anterior swelling and mild tenderness, prepatellar and infrapatellar bursa areas, no joint effusion Ankles full ROM no tenderness or swelling  Limited MSKUS exam with normal median nerve CSA, slight flattening 4:1 width:height No visible effusions or hyperemia on exam of MCP and PIP joints b/l  Investigation: No additional findings.  Imaging: No results found.  Recent Labs: Lab Results  Component Value Date   WBC 6.5 02/19/2023  HGB 15.0 02/19/2023   PLT 187 02/19/2023   NA 139 02/19/2023   K 4.0 02/19/2023   CL 99 02/19/2023   CO2 27 02/19/2023   GLUCOSE 107 (H) 02/19/2023   BUN 19 02/19/2023   CREATININE 0.65 02/19/2023   BILITOT 1.0 02/19/2023   ALKPHOS 68 02/19/2023   AST 46 (H) 02/19/2023   ALT 61 (H) 02/19/2023   PROT 7.9 02/19/2023   ALBUMIN 4.8 02/19/2023   CALCIUM 9.6 02/19/2023   GFRAA  03/07/2010    >60        The eGFR has been calculated using the MDRD equation. This calculation has not been validated in all clinical situations. eGFR's persistently <60 mL/min signify possible Chronic Kidney Disease.    Speciality Comments: No specialty comments available.  Procedures:  No procedures performed Allergies: Patient has no known allergies.   Assessment / Plan:     Visit Diagnoses: Positive ANA (antinuclear antibody) - Plan: Sedimentation rate, C-reactive protein, C3 and C4 Peripheral neuropathy Chronic numbness and pain in hands, possibly due to autoimmune inflammation or mechanical causes. Positive ANA and SSA antibodies suggest Sjogren's syndrome or other autoimmune  condition. No nerve conduction study performed. - Order additional blood tests for immune activation/systemic inflammation. - Consider hydroxychloroquine if autoimmune inflammation confirmed. - Recommend nerve conduction study if blood tests inconclusive. - May need to reassess during symptom flare if findings are nondiagnostic  Prepatellar bursitis Chronic swelling and pain in right knee due to fluid accumulation in prepatellar bursa, likely from occupational kneeling. - Use anti-inflammatory medications and icing. - Advise knee pads during work.   Orders: Orders Placed This Encounter  Procedures   Sedimentation rate   C-reactive protein   C3 and C4   No orders of the defined types were placed in this encounter.    Follow-Up Instructions: No follow-ups on file.     Fuller Plan, MD  Note - This record has been created using [employer].  Chart creation errors have been sought, but may not always  have been located. Such creation errors do not reflect on  the standard of medical care.

## 2023-07-24 ENCOUNTER — Ambulatory Visit: Payer: BC Managed Care – PPO | Attending: Internal Medicine | Admitting: Internal Medicine

## 2023-07-24 ENCOUNTER — Encounter: Payer: Self-pay | Admitting: Internal Medicine

## 2023-07-24 VITALS — BP 133/86 | HR 63 | Resp 14 | Ht 71.0 in | Wt 190.0 lb

## 2023-07-24 DIAGNOSIS — M7051 Other bursitis of knee, right knee: Secondary | ICD-10-CM | POA: Diagnosis not present

## 2023-07-24 DIAGNOSIS — R768 Other specified abnormal immunological findings in serum: Secondary | ICD-10-CM | POA: Diagnosis not present

## 2023-07-25 LAB — C3 AND C4
C3 Complement: 123 mg/dL (ref 82–185)
C4 Complement: 30 mg/dL (ref 15–53)
